# Patient Record
Sex: Female | Born: 1975 | Race: Black or African American | Hispanic: No | Smoking: Never smoker
Health system: Southern US, Community
[De-identification: ages and names within clinical notes are randomized; demographics above are authoritative.]

## PROBLEM LIST (undated history)

## (undated) DIAGNOSIS — I2699 Other pulmonary embolism without acute cor pulmonale: Secondary | ICD-10-CM

---

## 2002-03-14 ENCOUNTER — Inpatient Hospital Stay (HOSPITAL_COMMUNITY): Admission: AD | Admit: 2002-03-14 | Discharge: 2002-03-14 | Payer: Self-pay | Admitting: Obstetrics and Gynecology

## 2002-03-17 ENCOUNTER — Inpatient Hospital Stay (HOSPITAL_COMMUNITY): Admission: AD | Admit: 2002-03-17 | Discharge: 2002-03-20 | Payer: Self-pay | Admitting: Obstetrics

## 2002-03-18 ENCOUNTER — Encounter (INDEPENDENT_AMBULATORY_CARE_PROVIDER_SITE_OTHER): Payer: Self-pay | Admitting: *Deleted

## 2002-11-14 ENCOUNTER — Emergency Department (HOSPITAL_COMMUNITY): Admission: EM | Admit: 2002-11-14 | Discharge: 2002-11-14 | Payer: Self-pay | Admitting: Emergency Medicine

## 2002-11-14 ENCOUNTER — Encounter: Payer: Self-pay | Admitting: Emergency Medicine

## 2003-08-05 ENCOUNTER — Inpatient Hospital Stay (HOSPITAL_COMMUNITY): Admission: RE | Admit: 2003-08-05 | Discharge: 2003-08-07 | Payer: Self-pay | Admitting: Obstetrics

## 2005-02-16 ENCOUNTER — Emergency Department (HOSPITAL_COMMUNITY): Admission: EM | Admit: 2005-02-16 | Discharge: 2005-02-16 | Payer: Self-pay | Admitting: Emergency Medicine

## 2007-03-13 ENCOUNTER — Other Ambulatory Visit: Admission: RE | Admit: 2007-03-13 | Discharge: 2007-03-13 | Payer: Self-pay | Admitting: Pediatrics

## 2008-03-19 ENCOUNTER — Encounter: Admission: RE | Admit: 2008-03-19 | Discharge: 2008-03-19 | Payer: Self-pay | Admitting: Family Medicine

## 2008-07-04 ENCOUNTER — Inpatient Hospital Stay (HOSPITAL_COMMUNITY): Admission: EM | Admit: 2008-07-04 | Discharge: 2008-07-06 | Payer: Self-pay | Admitting: Emergency Medicine

## 2008-07-22 ENCOUNTER — Ambulatory Visit: Payer: Self-pay | Admitting: Oncology

## 2008-08-22 LAB — PROTIME-INR
INR: 2 (ref 2.00–3.50)
Protime: 24 Seconds — ABNORMAL HIGH (ref 10.6–13.4)

## 2010-04-04 ENCOUNTER — Encounter: Payer: Self-pay | Admitting: Internal Medicine

## 2010-06-23 LAB — APTT: aPTT: 32 seconds (ref 24–37)

## 2010-06-23 LAB — DIFFERENTIAL
Basophils Absolute: 0 10*3/uL (ref 0.0–0.1)
Basophils Absolute: 0.1 10*3/uL (ref 0.0–0.1)
Basophils Relative: 1 % (ref 0–1)
Basophils Relative: 1 % (ref 0–1)
Eosinophils Absolute: 0.3 10*3/uL (ref 0.0–0.7)
Eosinophils Relative: 2 % (ref 0–5)
Lymphocytes Relative: 18 % (ref 12–46)
Lymphocytes Relative: 29 % (ref 12–46)
Lymphs Abs: 1.7 10*3/uL (ref 0.7–4.0)
Lymphs Abs: 1.9 10*3/uL (ref 0.7–4.0)
Monocytes Absolute: 0.5 10*3/uL (ref 0.1–1.0)
Monocytes Absolute: 0.5 10*3/uL (ref 0.1–1.0)
Neutro Abs: 4.8 10*3/uL (ref 1.7–7.7)
Neutro Abs: 4.8 10*3/uL (ref 1.7–7.7)
Neutro Abs: 7 10*3/uL (ref 1.7–7.7)
Neutrophils Relative %: 63 % (ref 43–77)

## 2010-06-23 LAB — CBC
HCT: 42.9 % (ref 36.0–46.0)
Hemoglobin: 13.3 g/dL (ref 12.0–15.0)
Hemoglobin: 14.7 g/dL (ref 12.0–15.0)
MCHC: 33.7 g/dL (ref 30.0–36.0)
MCHC: 33.8 g/dL (ref 30.0–36.0)
MCV: 87.3 fL (ref 78.0–100.0)
Platelets: 291 10*3/uL (ref 150–400)
Platelets: 296 10*3/uL (ref 150–400)
Platelets: 332 10*3/uL (ref 150–400)
RDW: 12.4 % (ref 11.5–15.5)
WBC: 7.5 10*3/uL (ref 4.0–10.5)
WBC: 9.5 10*3/uL (ref 4.0–10.5)

## 2010-06-23 LAB — PROTIME-INR
INR: 1 (ref 0.00–1.49)
INR: 1 (ref 0.00–1.49)
Prothrombin Time: 13.8 seconds (ref 11.6–15.2)
Prothrombin Time: 13.8 seconds (ref 11.6–15.2)
Prothrombin Time: 17.4 seconds — ABNORMAL HIGH (ref 11.6–15.2)

## 2010-06-23 LAB — POCT I-STAT, CHEM 8
BUN: 8 mg/dL (ref 6–23)
Calcium, Ion: 1.16 mmol/L (ref 1.12–1.32)
Chloride: 105 mEq/L (ref 96–112)
HCT: 45 % (ref 36.0–46.0)
Potassium: 3.7 mEq/L (ref 3.5–5.1)

## 2010-06-23 LAB — PROTEIN S ACTIVITY: Protein S Activity: 103 % (ref 69–129)

## 2010-06-23 LAB — HOMOCYSTEINE: Homocysteine: 6.8 umol/L (ref 4.0–15.4)

## 2010-06-23 LAB — CARDIOLIPIN ANTIBODIES, IGG, IGM, IGA
Anticardiolipin IgG: <7 [GPL'U] — ABNORMAL LOW (ref <11)
Anticardiolipin IgM: <7 [MPL'U] — ABNORMAL LOW (ref <10)

## 2010-06-23 LAB — POCT PREGNANCY, URINE: Preg Test, Ur: NEGATIVE

## 2010-06-23 LAB — BETA-2-GLYCOPROTEIN I ABS, IGG/M/A: Beta-2 Glyco I IgG: <4 U/mL (ref <20)

## 2010-06-23 LAB — BRAIN NATRIURETIC PEPTIDE: Pro B Natriuretic peptide (BNP): <30 pg/mL (ref 0.0–100.0)

## 2010-06-23 LAB — LUPUS ANTICOAGULANT PANEL
DRVVT: 47.9 secs — ABNORMAL HIGH (ref 36.1–47.0)
Lupus Anticoagulant: NOT DETECTED
PTT Lupus Anticoagulant: 55.8 secs — ABNORMAL HIGH (ref 36.3–48.8)
PTTLA 4:1 Mix: 51 secs — ABNORMAL HIGH (ref 36.3–48.8)
PTTLA Confirmation: 2.3 secs (ref <8.0)

## 2010-06-23 LAB — PROTEIN C ACTIVITY: Protein C Activity: 140 % — ABNORMAL HIGH (ref 75–133)

## 2010-06-23 LAB — HCG, SERUM, QUALITATIVE: Preg, Serum: NEGATIVE

## 2010-06-23 LAB — PROTHROMBIN GENE MUTATION

## 2010-06-23 LAB — FACTOR 5 LEIDEN

## 2010-07-27 NOTE — H&P (Signed)
Jade Moore, Jade Moore           ACCOUNT NO.:  [id number]   MEDICAL RECORD NO.:  [id number]          PATIENT TYPE:  INP   LOCATION:  3708                         FACILITY:  [facility]   PHYSICIAN:  Kela Millin, M.D.DATE OF BIRTH:  December 05, 1975   DATE OF ADMISSION:  07/04/2008  DATE OF DISCHARGE:                              HISTORY & PHYSICAL   CHIEF COMPLAINT:  Shortness of breath and pleuritic pain.   HISTORY OF PRESENT ILLNESS:  The patient is a 35 year old white female  with no significant past medical history who presents with above  complaints.  She states that she was in her usual state of health until  5 days ago when she began having some shortness of breath along with  upper respiratory symptoms.  She went to the [facility]  and was given medications for her allergies, a chest x-ray was done and  per patient it was negative at the time.  By the next day she developed  right-sided pleuritic chest pain and for the next couple of days it  continued to get worse so she went back to the [facility] 2  days ago and she was then started on Zithromax and she also had told  them that her brother had died at age 1 with PE/blood clots, and so at  that time what sounds like a D-dimer was ordered.  The patient states  that she was then called this morning and advised that her test was  elevated and she needed to come to the ED for further evaluation.  Upon  arrival in the ED a CT angiogram of her chest was done and it revealed  bilateral pulmonary emboli with right lower lobe pulmonary infarct.  An  EKG showed sinus rhythm at 82, no acute ischemic changes.  She is  admitted for further evaluation and management.  She denies hemoptysis,  melena, hematochezia, dysuria, fevers, nausea and no vomiting.  She  states that about 3 weeks ago she drove about 3 hours to go see her  mother, no recent air travel.  She denies any miscarriages and states  that her last  surgery was 5 years ago when she had a C-section.  Again  as noted above her brother died at age 27 with pulmonary emboli.   PAST MEDICAL HISTORY:  As above.   MEDICATIONS:  1. Zithromax.  2. Darvocet p.r.n.   ALLERGIES:  NKDA.   SOCIAL HISTORY:  She denies tobacco, occasional beer.   FAMILY HISTORY:  As above, brother deceased age 22 of PEs.   REVIEW OF SYSTEMS:  As per HPI, other review of systems negative.   PHYSICAL EXAM:  GENERAL:  Patient is a pleasant, young, well-developed,  well-nourished black female in no apparent distress.  VITAL SIGNS:  Temperature is 98, blood pressure 104/63, initially  131/74, pulse is 80, respiratory rate is 14, O2 sat 99%.  HEENT:  PERRL, EOMI, sclerae anicteric, moist mucous membranes and no  oral exudates.  NECK:  Supple, no adenopathy, no thyromegaly and no JVD.  CARDIOVASCULAR:  Regular rate and rhythm, normal S1-S2.  LUNGS:  Clear to auscultation bilaterally, no crackles or wheezes.  ABDOMEN:  Soft, bowel sounds present, nontender, nondistended, no  organomegaly and no masses palpable.  EXTREMITIES:  No cyanosis and no edema, no calf tenderness.  NEURO:  She is alert and oriented x3.  Cranial nerves II-XII grossly  intact.   LABORATORY DATA:  As per HPI, also white cell count is 9.5, hemoglobin  is 14.7, hematocrit 42.9, platelet count 296, sodium is 141, potassium  3.7, chloride is 105, BUN is 8, creatinine 1.2, glucose 77, ionized  calcium is 1.16, the brain natruretic peptide is less than 30 and urine  pregnancy test is negative.   ASSESSMENT AND PLAN:  1. Bilateral pulmonary emboli with right lower lobe pulmonary      infarction - as discussed above, will obtain a hypercoagulable      panel.  Will start patient on Lovenox and Coumadin, obtain stool      guaiacs and follow -pain management and monitor on Tele.      Kela Millin, M.D.  Electronically Signed     ACV/MEDQ  D:  07/05/2008  T:  07/05/2008  Job:  [id number]    cc:   Claude Manges, PA

## 2010-07-30 NOTE — Discharge Summary (Signed)
NAME:  Jade Moore, Jade Moore                     ACCOUNT NO.:  [id number]   MEDICAL RECORD NO.:  [id number]                   PATIENT TYPE:  INP   LOCATION:  9121                                 FACILITY:  [facility]   PHYSICIAN:  Kathreen Cosier, M.D.           DATE OF BIRTH:  March 02, 1976   DATE OF ADMISSION:  08/05/2003  DATE OF DISCHARGE:  08/07/2003                                 DISCHARGE SUMMARY   HOSPITAL COURSE:  The patient is a 35 year old gravida 3 para 2-0-0-2 with  EDC Aug 07, 2003 who had a previous C-section and desired repeat C-section.  The patient had a repeat low transverse cesarean section on May 24.  She had  a female, Apgar 8 and 9, weighing 7 pounds.  On admission her hemoglobin was  12, platelets 216.  Post C-section hemoglobin 10.3.  Electrolytes were  normal.  The patient was discharged on postoperative day #2 ambulatory, on a  regular diet, on Tylox for pain and ferrous sulfate for her anemia.   DISCHARGE DIAGNOSIS:  Status post repeat low transverse cesarean section at  term.                                               Kathreen Cosier, M.D.    BAM/MEDQ  D:  08/20/2003  T:  08/20/2003  Job:  [id number]

## 2010-07-30 NOTE — Op Note (Signed)
NAME:  Jade Moore, Jade Moore                     ACCOUNT NO.:  [id number]   MEDICAL RECORD NO.:  [id number]                   PATIENT TYPE:  INP   LOCATION:  9198                                 FACILITY:  [facility]   PHYSICIAN:  Kathreen Cosier, M.D.           DATE OF BIRTH:  September 08, 1975   DATE OF PROCEDURE:  08/05/2003  DATE OF DISCHARGE:                                 OPERATIVE REPORT   PREOPERATIVE DIAGNOSIS:  Previous cesarean section, at term.   SURGEON:  Kathreen Cosier, M.D.   FIRST ASSISTANT:  Dr. __________.   PROCEDURE:  The patient placed in a supine position on the operating table  after the spinal administered, abdomen prepped and draped, bladder emptied  with a Foley catheter.  A transverse incision suprapubically made through  the old scar and carried down to the rectus fascia.  Fascia cleaned and  incised the length of the incision.  Recti muscles were retracted laterally,  peritoneum incised longitudinally.  The transverse incision made in the  visceral peritoneum below the bladder, the bladder mobilized inferiorly.  A  transverse lower uterine incision made, the fluid was clear.  The patient  delivered in an LOA position of a female, Apgar 8 and 9, weighing 7 pounds.  The team was in attendance.  The placenta was posterior and removed  manually.  The uterine cavity cleaned with a dry lap.  Uterine incision  closed in one layer with a continuous suture of #1 chromic, hemostasis was  satisfactory.  Bladder flap reattached with 2-0 chromic.  Uterus well-  contracted.  Tubes and ovaries normal.  Abdomen closed in layers, the  peritoneum with a continuous suture of 0 chromic, the fascia with a  continuous suture of 0 Dexon, and the skin closed with subcuticular stitch  of 4-0 Monocryl.  Blood loss 500 mL.  The patient tolerated the procedure  well, taken to the recovery room in good condition.                                               Kathreen Cosier,  M.D.    BAM/MEDQ  D:  08/05/2003  T:  08/06/2003  Job:  [id number]

## 2012-06-10 ENCOUNTER — Emergency Department (HOSPITAL_COMMUNITY)
Admission: EM | Admit: 2012-06-10 | Discharge: 2012-06-10 | Disposition: A | Discharge status: Home / Self Care | Payer: No Typology Code available for payment source | Attending: Emergency Medicine | Admitting: Emergency Medicine

## 2012-06-10 ENCOUNTER — Encounter (HOSPITAL_COMMUNITY): Payer: Self-pay | Admitting: *Deleted

## 2012-06-10 ENCOUNTER — Emergency Department (HOSPITAL_COMMUNITY): Payer: No Typology Code available for payment source

## 2012-06-10 DIAGNOSIS — S46909A Unspecified injury of unspecified muscle, fascia and tendon at shoulder and upper arm level, unspecified arm, initial encounter: Secondary | ICD-10-CM | POA: Insufficient documentation

## 2012-06-10 DIAGNOSIS — Y9389 Activity, other specified: Secondary | ICD-10-CM | POA: Insufficient documentation

## 2012-06-10 DIAGNOSIS — G44209 Tension-type headache, unspecified, not intractable: Secondary | ICD-10-CM

## 2012-06-10 DIAGNOSIS — Z7901 Long term (current) use of anticoagulants: Secondary | ICD-10-CM | POA: Insufficient documentation

## 2012-06-10 DIAGNOSIS — S4980XA Other specified injuries of shoulder and upper arm, unspecified arm, initial encounter: Secondary | ICD-10-CM | POA: Insufficient documentation

## 2012-06-10 DIAGNOSIS — Y92410 Unspecified street and highway as the place of occurrence of the external cause: Secondary | ICD-10-CM | POA: Insufficient documentation

## 2012-06-10 DIAGNOSIS — IMO0002 Reserved for concepts with insufficient information to code with codable children: Secondary | ICD-10-CM | POA: Insufficient documentation

## 2012-06-10 DIAGNOSIS — Z86711 Personal history of pulmonary embolism: Secondary | ICD-10-CM | POA: Insufficient documentation

## 2012-06-10 DIAGNOSIS — T148XXA Other injury of unspecified body region, initial encounter: Secondary | ICD-10-CM

## 2012-06-10 DIAGNOSIS — S0990XA Unspecified injury of head, initial encounter: Secondary | ICD-10-CM | POA: Insufficient documentation

## 2012-06-10 HISTORY — DX: Other pulmonary embolism without acute cor pulmonale: I26.99

## 2012-06-10 MED ORDER — CYCLOBENZAPRINE HCL 10 MG PO TABS: 10.0000 mg | ORAL_TABLET | Freq: Two times a day (BID) | ORAL | Status: DC | PRN

## 2012-06-10 MED ORDER — OXYCODONE-ACETAMINOPHEN 5-325 MG PO TABS: 1.0000 | ORAL_TABLET | ORAL | Status: DC | PRN
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
## 2012-06-13 NOTE — ED Provider Notes (Signed)
Medical screening examination/treatment/procedure(s) were performed by non-physician practitioner and as supervising physician I was immediately available for consultation/collaboration.   Carleene Cooper III, MD 06/13/12 2119

## 2013-01-26 ENCOUNTER — Emergency Department (HOSPITAL_COMMUNITY): Payer: BC Managed Care – PPO

## 2013-01-26 ENCOUNTER — Emergency Department (HOSPITAL_COMMUNITY)
Admission: EM | Admit: 2013-01-26 | Discharge: 2013-01-26 | Disposition: A | Discharge status: Home / Self Care | Payer: BC Managed Care – PPO | Attending: Emergency Medicine | Admitting: Emergency Medicine

## 2013-01-26 ENCOUNTER — Encounter (HOSPITAL_COMMUNITY): Payer: Self-pay | Admitting: Emergency Medicine

## 2013-01-26 DIAGNOSIS — K1379 Other lesions of oral mucosa: Secondary | ICD-10-CM

## 2013-01-26 DIAGNOSIS — Z86711 Personal history of pulmonary embolism: Secondary | ICD-10-CM | POA: Insufficient documentation

## 2013-01-26 DIAGNOSIS — K1370 Unspecified lesions of oral mucosa: Secondary | ICD-10-CM | POA: Insufficient documentation

## 2013-01-26 DIAGNOSIS — Z7901 Long term (current) use of anticoagulants: Secondary | ICD-10-CM | POA: Insufficient documentation

## 2013-01-26 LAB — POCT I-STAT, CHEM 8
Calcium, Ion: 1.24 mmol/L — ABNORMAL HIGH (ref 1.12–1.23)
Creatinine, Ser: 1.2 mg/dL — ABNORMAL HIGH (ref 0.50–1.10)
Glucose, Bld: 89 mg/dL (ref 70–99)
HCT: 42 % (ref 36.0–46.0)
Hemoglobin: 14.3 g/dL (ref 12.0–15.0)

## 2013-01-26 LAB — D-DIMER, QUANTITATIVE (NOT AT ARMC): D-Dimer, Quant: 0.28 ug/mL-FEU (ref 0.00–0.48)

## 2013-01-26 LAB — PROTIME-INR
INR: 1.53 — ABNORMAL HIGH (ref 0.00–1.49)
Prothrombin Time: 18 seconds — ABNORMAL HIGH (ref 11.6–15.2)

## 2013-01-26 MED ORDER — IOHEXOL 350 MG/ML SOLN
80.0000 mL | Freq: Once | INTRAVENOUS | Status: AC | PRN
  Administered 2013-01-26: 80 mL via INTRAVENOUS

## 2013-01-26 MED ORDER — DOXYCYCLINE HYCLATE 100 MG PO CAPS: 100.0000 mg | ORAL_CAPSULE | Freq: Two times a day (BID) | ORAL | Status: DC

## 2013-01-26 MED ORDER — SODIUM CHLORIDE 0.9 % IV BOLUS (SEPSIS)
1000.0000 mL | Freq: Once | INTRAVENOUS | Status: AC
  Administered 2013-01-26: 1000 mL via INTRAVENOUS

## 2013-01-26 NOTE — ED Provider Notes (Signed)
CSN: [id number]     Arrival date & time 01/26/13  0912 History   First MD Initiated Contact with Patient 01/26/13 0919     No chief complaint on file.  (Consider location/radiation/quality/duration/timing/severity/associated sxs/prior Treatment) HPI Patient lives the emergency department due to the fact that she woke up with a blood clot in her mouth this morning.  The patient was worried that she may have had blood clot in her lung.  Patient denies chest pain, shortness of breath, nausea, vomiting, headache, blurred vision, dizziness, weakness, or fever.  Patient, states, that her main concern is that she has a blood clot and would like a CT scan Past Medical History  Diagnosis Date  . Pulmonary embolism    Past Surgical History  Procedure Laterality Date  . Cesarean section     No family history on file. History  Substance Use Topics  . Smoking status: Never Smoker   . Smokeless tobacco: Not on file  . Alcohol Use: No   OB History   Grav Para Term Preterm Abortions TAB SAB Ect Mult Living                 Review of Systems All other systems negative except as documented in the HPI. All pertinent positives and negatives as reviewed in the HPI. Allergies  Review of patient's allergies indicates no known allergies.  Home Medications   Current Outpatient Rx  Name  Route  Sig  Dispense  Refill  . cetirizine (ZYRTEC) 10 MG tablet   Oral   Take 10 mg by mouth at bedtime as needed for allergies.          Marland Kitchen warfarin (COUMADIN) 5 MG tablet   Oral   Take 5-7.5 mg by mouth at bedtime. Takes 7.5 mg on Monday, Tuesday and Wednesday and then 5 mg on all other days.          BP 117/71  Pulse 60  Temp(Src) 98.2 F (36.8 C) (Oral)  Resp 16  Ht 5\' 3"  (1.6 m)  Wt 169 lb (76.658 kg)  BMI 29.94 kg/m2  SpO2 100%  LMP 01/20/2013 Physical Exam  Nursing note and vitals reviewed. Constitutional: She is oriented to person, place, and time. She appears well-developed and  well-nourished. No distress.  HENT:  Head: Normocephalic and atraumatic.  Mouth/Throat: Oropharynx is clear and moist.  Eyes: Pupils are equal, round, and reactive to light.  Neck: Normal range of motion. Neck supple.  Cardiovascular: Normal rate, regular rhythm and normal heart sounds.  Exam reveals no gallop and no friction rub.   No murmur heard. Pulmonary/Chest: Effort normal and breath sounds normal. No respiratory distress.  Abdominal: Soft. Bowel sounds are normal. She exhibits no distension. There is no tenderness.  Neurological: She is alert and oriented to person, place, and time.  Skin: Skin is warm and dry.    ED Course  Procedures (including critical care time) Labs Review Labs Reviewed  PROTIME-INR - Abnormal; Notable for the following:    Prothrombin Time 18.0 (*)    INR 1.53 (*)    All other components within normal limits  POCT I-STAT, CHEM 8 - Abnormal; Notable for the following:    Creatinine, Ser 1.20 (*)    Calcium, Ion 1.24 (*)    All other components within normal limits  D-DIMER, QUANTITATIVE   Imaging Review Ct Angio Chest Pe W/cm &/or Wo Cm  01/26/2013   CLINICAL DATA:  History pulmonary embolus.  Hemoptysis.  EXAM: CT ANGIOGRAPHY  CHEST WITH CONTRAST  TECHNIQUE: Multidetector CT imaging of the chest was performed using the standard protocol during bolus administration of intravenous contrast. Multiplanar CT image reconstructions including MIPs were obtained to evaluate the vascular anatomy.  CONTRAST:  80mL OMNIPAQUE IOHEXOL 350 MG/ML SOLN  COMPARISON:  CTA chest 07/04/2008.  FINDINGS: Pulmonary arterial opacification is excellent. There is no focal filling defect to suggest pulmonary embolus. The heart size is normal. There is no significant pleural or pericardial effusion. The mediastinum is unremarkable. The thoracic inlet is within normal limits. Limited imaging of the abdomen is unremarkable.  The lung windows demonstrate mild asymmetric airspace disease  at the right lung base. Minimal atelectasis is present on the long left. The lungs are otherwise clear.  The bone windows are unremarkable.  Review of the MIP images confirms the above findings.  IMPRESSION: 1. No evidence for pulmonary embolus. 2. Asymmetric right lower lobe airspace disease. While this may represent atelectasis, early infection is not excluded.   Electronically Signed   By: Gennette Pac M.D.   On: 01/26/2013 12:25    EKG Interpretation   None      patient has a negative CT scan of her chest is advised to return here as needed.  I did ask her to increase her dose of Coumadin for the next several days.  Total follow up with her primary care Dr. told to return here as needed  MDM   1. Blood in mouth of unknown source       Carlyle Dolly, [place] 01/27/13 1532

## 2013-01-26 NOTE — ED Notes (Signed)
Pt comfortable with d/c and f/u instructions. Prescriptions x1 

## 2013-01-26 NOTE — ED Notes (Signed)
Pt dx with blood clots in lungs 3 years ago, on coumadin, gets levels checked once a month, hasn't had levels checked in last two months due to missed appt, so far levels have been normal, states she woke up this morning with a blood clot inside her mouth. Concern for blood clots in lungs. Denies any pain, any SOB, has no symptoms. Pt in NAD, ambulatory to room. Lung sounds clear.

## 2013-01-27 NOTE — ED Provider Notes (Signed)
Medical screening examination/treatment/procedure(s) were conducted as a shared visit with non-physician practitioner(s) and myself.  I personally evaluated the patient during the encounter.  EKG Interpretation   None       I interviewed and examined the patient. Lungs are CTAB. Cardiac exam wnl. Abdomen soft.  Pt is mod risk wells. I discussed getting a d-dimer w/ her. She prefers to get a CT scan. Will order.   Junius Argyle, MD 01/27/13 [phone]

## 2013-05-13 ENCOUNTER — Emergency Department (HOSPITAL_COMMUNITY): Payer: Worker's Compensation

## 2013-05-13 ENCOUNTER — Encounter (HOSPITAL_COMMUNITY): Payer: Self-pay | Admitting: Emergency Medicine

## 2013-05-13 ENCOUNTER — Emergency Department (HOSPITAL_COMMUNITY)
Admission: EM | Admit: 2013-05-13 | Discharge: 2013-05-13 | Disposition: A | Discharge status: Home / Self Care | Payer: Worker's Compensation | Attending: Emergency Medicine | Admitting: Emergency Medicine

## 2013-05-13 DIAGNOSIS — W010XXA Fall on same level from slipping, tripping and stumbling without subsequent striking against object, initial encounter: Secondary | ICD-10-CM | POA: Insufficient documentation

## 2013-05-13 DIAGNOSIS — T07XXXA Unspecified multiple injuries, initial encounter: Secondary | ICD-10-CM | POA: Insufficient documentation

## 2013-05-13 DIAGNOSIS — Z86711 Personal history of pulmonary embolism: Secondary | ICD-10-CM | POA: Insufficient documentation

## 2013-05-13 DIAGNOSIS — Y990 Civilian activity done for income or pay: Secondary | ICD-10-CM | POA: Insufficient documentation

## 2013-05-13 DIAGNOSIS — Y9389 Activity, other specified: Secondary | ICD-10-CM | POA: Insufficient documentation

## 2013-05-13 DIAGNOSIS — Z7901 Long term (current) use of anticoagulants: Secondary | ICD-10-CM | POA: Insufficient documentation

## 2013-05-13 DIAGNOSIS — IMO0002 Reserved for concepts with insufficient information to code with codable children: Secondary | ICD-10-CM | POA: Insufficient documentation

## 2013-05-13 DIAGNOSIS — Y9289 Other specified places as the place of occurrence of the external cause: Secondary | ICD-10-CM | POA: Insufficient documentation

## 2013-05-13 DIAGNOSIS — W1809XA Striking against other object with subsequent fall, initial encounter: Secondary | ICD-10-CM | POA: Insufficient documentation

## 2013-05-13 DIAGNOSIS — Z792 Long term (current) use of antibiotics: Secondary | ICD-10-CM | POA: Insufficient documentation

## 2013-05-13 DIAGNOSIS — S46919A Strain of unspecified muscle, fascia and tendon at shoulder and upper arm level, unspecified arm, initial encounter: Secondary | ICD-10-CM

## 2013-05-13 MED ORDER — HYDROCODONE-ACETAMINOPHEN 5-325 MG PO TABS: ORAL_TABLET | ORAL | Status: AC

## 2013-05-13 NOTE — Discharge Instructions (Signed)
Your x-rays are negative for fractures or dislocations. Please use Tylenol for mild pain, use Norco for more severe pain. Norco may cause drowsiness, please use with caution. Please observe for any excessive bruising or swelling. Contusion A contusion is a deep bruise. Contusions happen when an injury causes bleeding under the skin. Signs of bruising include pain, puffiness (swelling), and discolored skin. The contusion may turn blue, purple, or yellow. HOME CARE   Put ice on the injured area.  Put ice in a plastic bag.  Place a towel between your skin and the bag.  Leave the ice on for 15-20 minutes, 03-04 times a day.  Only take medicine as told by your doctor.  Rest the injured area.  If possible, raise (elevate) the injured area to lessen puffiness. GET HELP RIGHT AWAY IF:   You have more bruising or puffiness.  You have pain that is getting worse.  Your puffiness or pain is not helped by medicine. MAKE SURE YOU:   Understand these instructions.  Will watch your condition.  Will get help right away if you are not doing well or get worse. Document Released: 08/17/2007 Document Revised: 05/23/2011 Document Reviewed: 01/03/2011 [facility] Patient Information 2014 [place], [place].

## 2013-05-13 NOTE — ED Notes (Signed)
Per pt sts she fell on the sidewalk at work this am. sts right wrist/hand pain, right shoulder and right side.

## 2013-05-13 NOTE — ED Provider Notes (Signed)
CSN: [id number]     Arrival date & time 05/13/13  1112 History  This chart was scribed for non-physician practitioner, Ivery QualeHobson Shantice Menger, PA-C working with Suzi RootsKevin E Steinl, MD by Greggory StallionKayla Andersen, ED scribe. This patient was seen in room TR06C/TR06C and the patient's care was started at 12:54 PM.   Chief Complaint  Patient presents with  . Fall   The history is provided by the patient. No language interpreter was used.   HPI Comments: Jade Moore is a 38 y.o. female who presents to the Emergency Department complaining of a fall that occurred earlier this morning. Pt states she slipped on ice, fell and landed on her right hand and leg. She hit her head on the right side but denies LOC. Pt has sudden onset right wrist and hand pain with associated swelling. She also has gradual onset right shoulder pain and generalized right sided pain. Certain movements worsen her hand, wrist and shoulder pain. Pt has used ice on her hand with some relief of swelling. She currently takes coumadin daily. Her last INR was about 3 weeks ago and it was 2.3.  Past Medical History  Diagnosis Date  . Pulmonary embolism    Past Surgical History  Procedure Laterality Date  . Cesarean section     History reviewed. No pertinent family history. History  Substance Use Topics  . Smoking status: Never Smoker   . Smokeless tobacco: Not on file  . Alcohol Use: No   OB History   Grav Para Term Preterm Abortions TAB SAB Ect Mult Living                 Review of Systems  Musculoskeletal: Positive for arthralgias, joint swelling and myalgias.  All other systems reviewed and are negative.   Allergies  Review of patient's allergies indicates no known allergies.  Home Medications   Current Outpatient Rx  Name  Route  Sig  Dispense  Refill  . cetirizine (ZYRTEC) 10 MG tablet   Oral   Take 10 mg by mouth at bedtime as needed for allergies.          Marland Kitchen. doxycycline (VIBRAMYCIN) 100 MG capsule   Oral   Take 1  capsule (100 mg total) by mouth 2 (two) times daily.   20 capsule   0   . warfarin (COUMADIN) 5 MG tablet   Oral   Take 5-7.5 mg by mouth at bedtime. Takes 7.5 mg on Monday, Tuesday and Wednesday and then 5 mg on all other days.          BP 113/78  Pulse 77  Temp(Src) 98.3 F (36.8 C)  Resp 18  SpO2 100%  LMP 05/13/2013  Physical Exam  Nursing note and vitals reviewed. Constitutional: She is oriented to person, place, and time. She appears well-developed and well-nourished. No distress.  HENT:  Head: Normocephalic and atraumatic.  No orbit step off. No hematoma of the nose. No TMJ abnormality. No mastoid or periauricular bruise.   Eyes: EOM are normal. Pupils are equal, round, and reactive to light.  No subconjunctival hemorrhage.   Neck: Neck supple. No tracheal deviation present.  Cardiovascular: Normal rate.   Pulmonary/Chest: Effort normal and breath sounds normal. No respiratory distress. She has no wheezes. She has no rhonchi. She has no rales.  Musculoskeletal: Normal range of motion.  Soreness around the scapula on the right. No bruising. No deformity. Right paraspinal soreness in the lumbar region. Right lateral thigh soreness. No bruising. Full ROM  of right knee. Full ROM of right ankle. Dorsalis pedis 2+ bilaterally.   Neurological: She is alert and oriented to person, place, and time.  Skin: Skin is warm and dry.  Psychiatric: She has a normal mood and affect. Her behavior is normal.    ED Course  Procedures (including critical care time)  DIAGNOSTIC STUDIES: Oxygen Saturation is 100% on RA, normal by my interpretation.    COORDINATION OF CARE: 1:03 PM-Discussed treatment plan which includes tylenol and a short course of stronger pain medication with pt at bedside and pt agreed to plan.   Labs Review Labs Reviewed - No data to display Imaging Review Dg Shoulder Right  05/13/2013   CLINICAL DATA:  FALL  EXAM: RIGHT SHOULDER - 2+ VIEW  COMPARISON:  None.   FINDINGS: There is no evidence of fracture or dislocation. There is no evidence of arthropathy or other focal bone abnormality. Soft tissues are unremarkable.  IMPRESSION: Negative.   Electronically Signed   By: Elige Ko   On: 05/13/2013 12:36   Dg Hand Complete Right  05/13/2013   CLINICAL DATA:  FALL  EXAM: RIGHT HAND - COMPLETE 3+ VIEW  COMPARISON:  None.  FINDINGS: There is no evidence of fracture or dislocation. There is no evidence of arthropathy or other focal bone abnormality. Soft tissues are unremarkable.  IMPRESSION: Negative.   Electronically Signed   By: Elige Ko   On: 05/13/2013 12:36     EKG Interpretation None      MDM Pt sustained injury following a fall to the sidewalk at work. No hematomas noted. No excessive swelling(pt on coumadin). Pt INR last week was 2.3. Xray of the right hand is negative. Xray of the right shoulder is negative for fracture or dislocation. No neurovascular deficit. Rx for norco given.   Final diagnoses:  None    **I have reviewed nursing notes, vital signs, and all appropriate lab and imaging results for this patient.*  **I personally performed the services described in this documentation, which was scribed in my presence. The recorded information has been reviewed and is accurate.Kathie Dike, PA-C 05/16/13 1238

## 2013-05-19 NOTE — ED Provider Notes (Signed)
Medical screening examination/treatment/procedure(s) were performed by non-physician practitioner and as supervising physician I was immediately available for consultation/collaboration.   EKG Interpretation None        Izyan Ezzell E Mahrukh Seguin, MD 05/19/13 0712 

## 2014-04-23 IMAGING — CR DG HAND COMPLETE 3+V*R*
3 series · 3 of 3 positions shown · non-contrast
Comparison: None.

CLINICAL DATA: FALL

EXAM:
RIGHT HAND - COMPLETE 3+ VIEW

[x hand pa right]
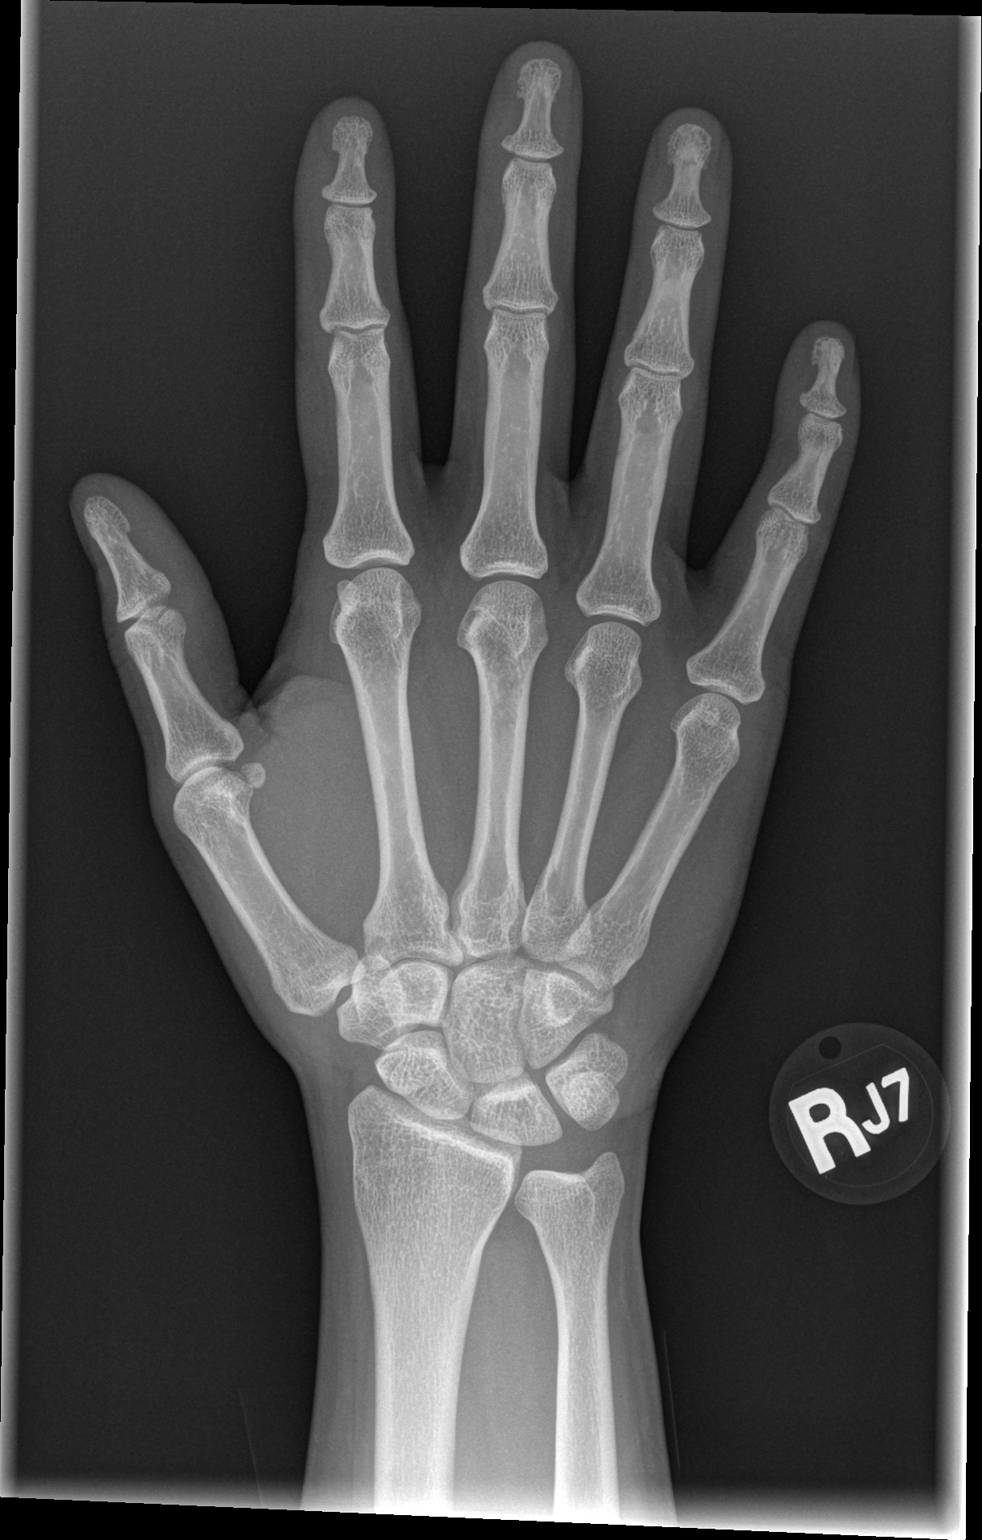

[x hand oblique right]
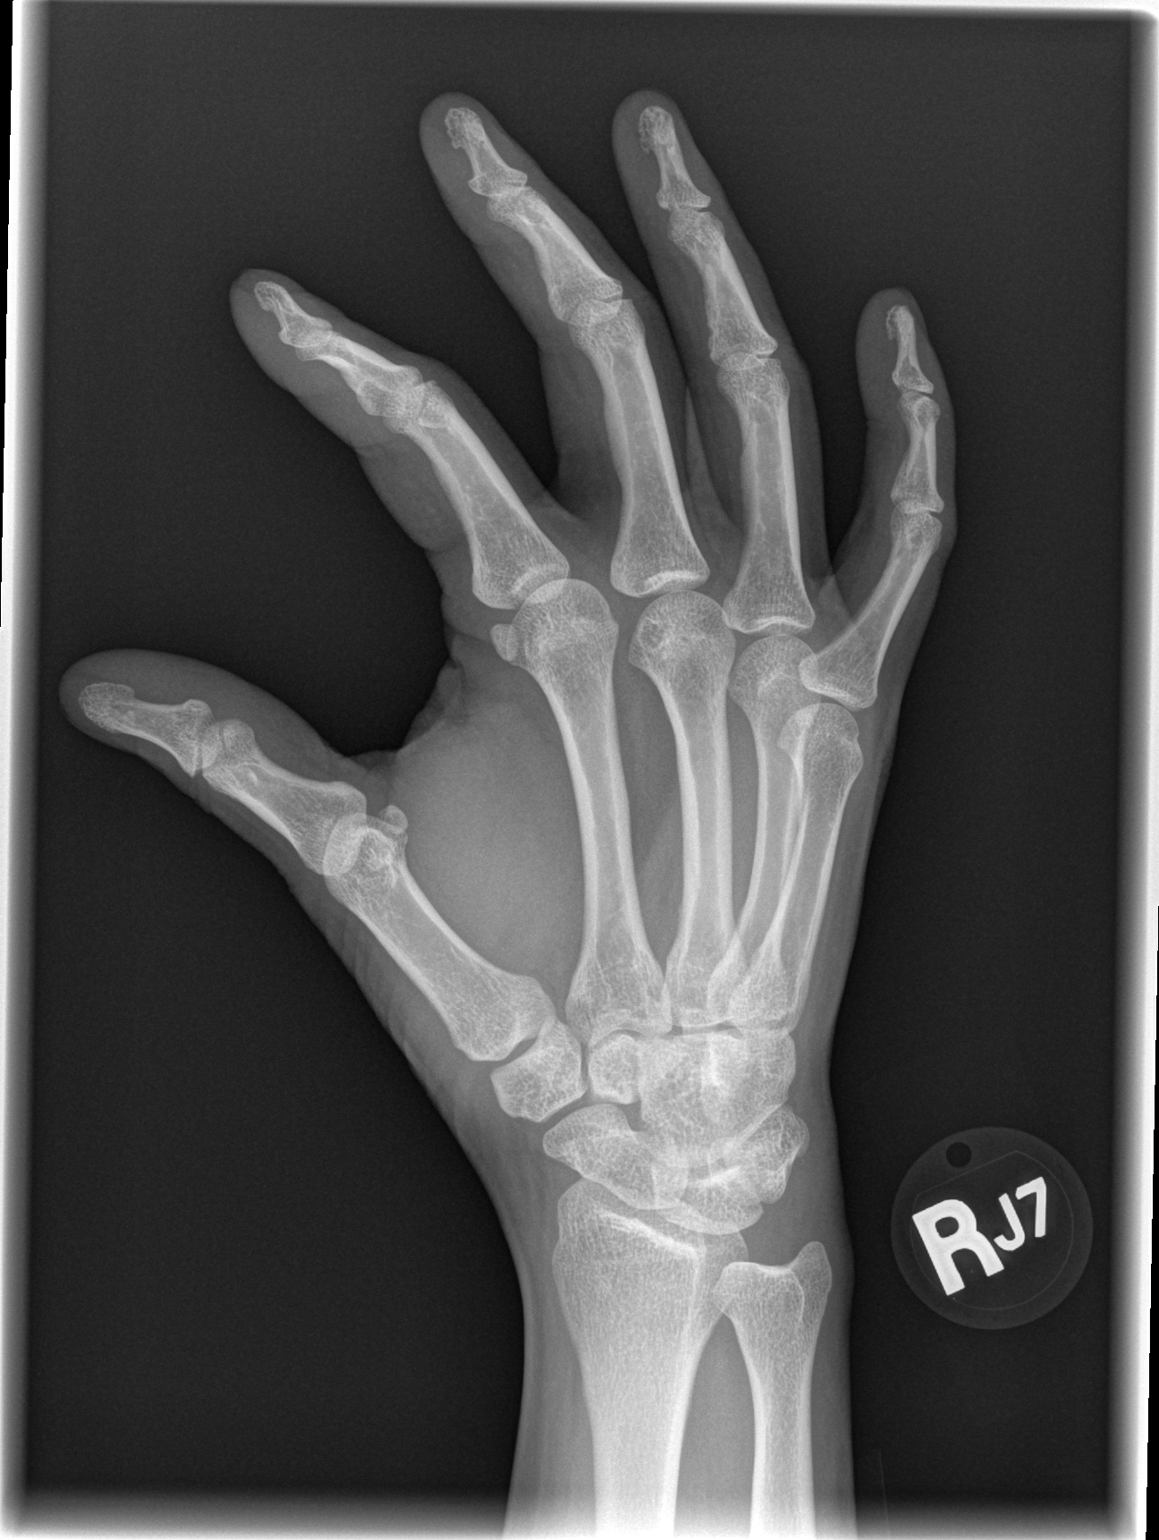

[x hand lat right]
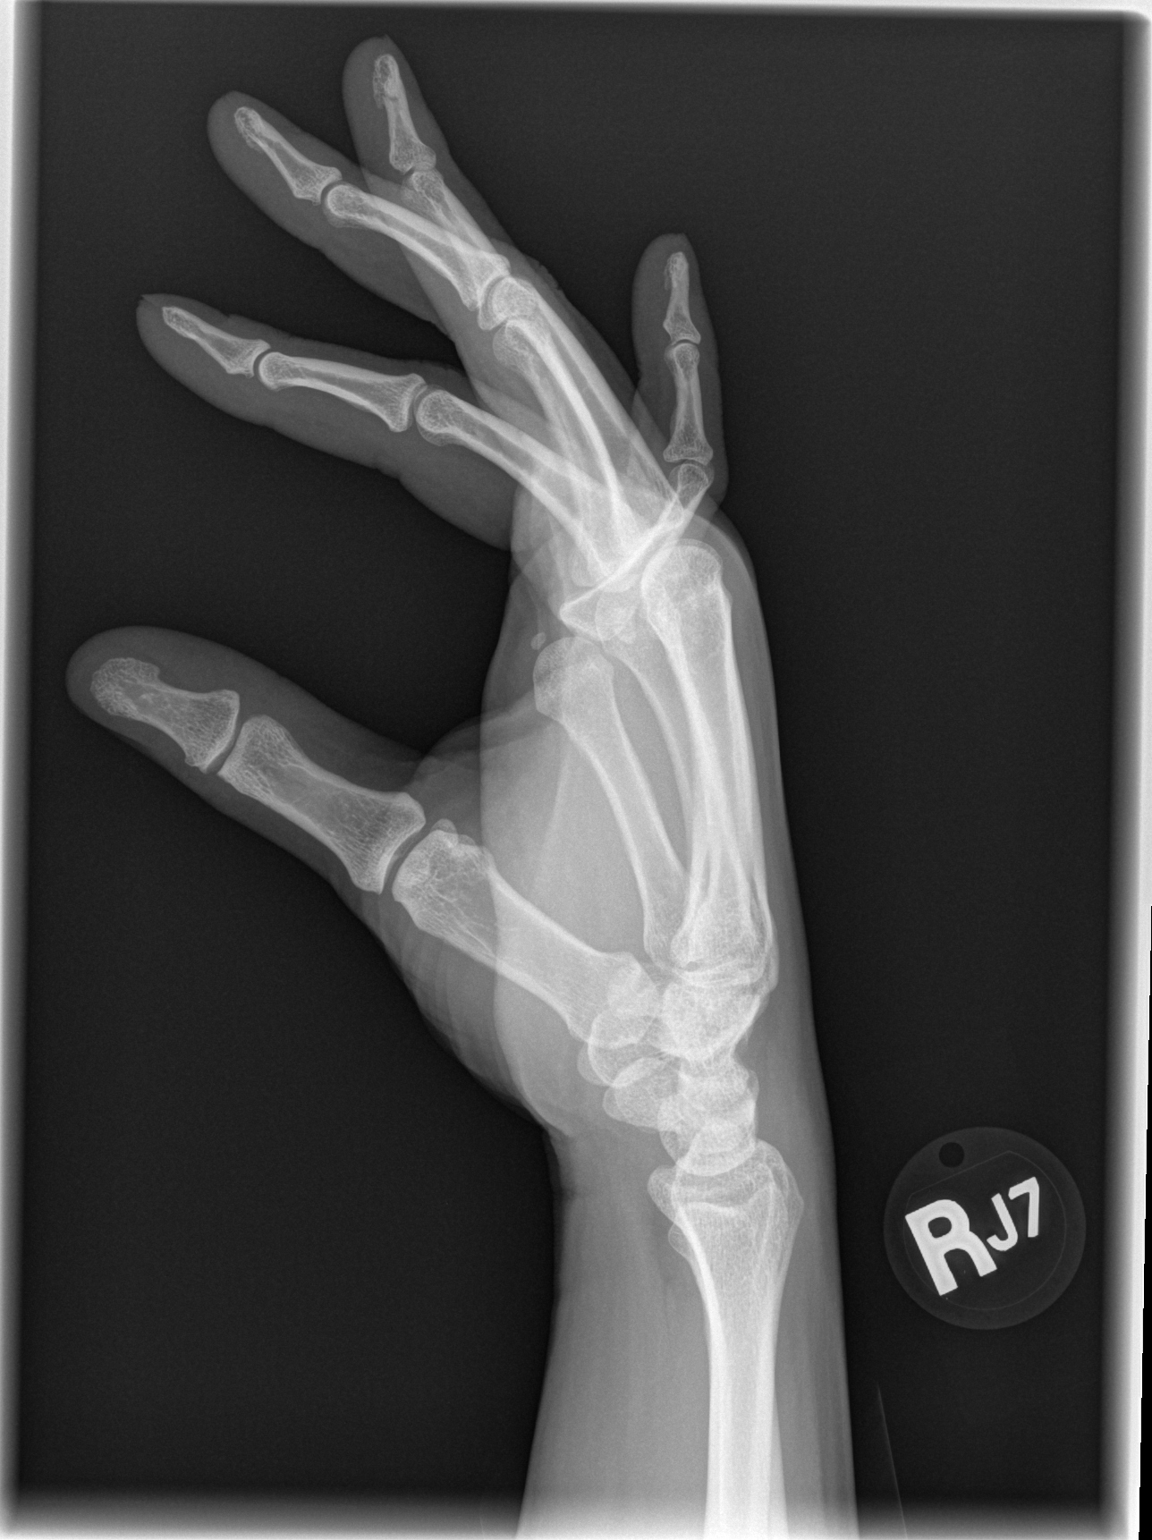

[3 of 3 positions shown; findings below may reference images not displayed]

FINDINGS: There is no evidence of fracture or dislocation. There is no
evidence of arthropathy or other focal bone abnormality. Soft
tissues are unremarkable.
IMPRESSION: Negative.

## 2014-11-04 ENCOUNTER — Other Ambulatory Visit: Payer: Self-pay | Admitting: Obstetrics and Gynecology

## 2014-11-04 DIAGNOSIS — R928 Other abnormal and inconclusive findings on diagnostic imaging of breast: Secondary | ICD-10-CM

## 2014-11-06 ENCOUNTER — Ambulatory Visit
Admission: RE | Admit: 2014-11-06 | Discharge: 2014-11-06 | Disposition: A | Discharge status: Home / Self Care | Payer: BLUE CROSS/BLUE SHIELD | Source: Ambulatory Visit | Attending: Obstetrics and Gynecology | Admitting: Obstetrics and Gynecology

## 2014-11-06 DIAGNOSIS — R928 Other abnormal and inconclusive findings on diagnostic imaging of breast: Secondary | ICD-10-CM

## 2015-06-30 ENCOUNTER — Other Ambulatory Visit: Payer: Self-pay | Admitting: Obstetrics and Gynecology

## 2015-06-30 DIAGNOSIS — R921 Mammographic calcification found on diagnostic imaging of breast: Secondary | ICD-10-CM

## 2016-02-08 ENCOUNTER — Ambulatory Visit
Admission: RE | Admit: 2016-02-08 | Discharge: 2016-02-08 | Disposition: A | Discharge status: Home / Self Care | Payer: BLUE CROSS/BLUE SHIELD | Source: Ambulatory Visit | Attending: Obstetrics and Gynecology | Admitting: Obstetrics and Gynecology

## 2016-02-08 DIAGNOSIS — R921 Mammographic calcification found on diagnostic imaging of breast: Secondary | ICD-10-CM
